# Patient Record
Sex: Male | Born: 1970 | Race: Black or African American | Hispanic: No | Marital: Married | Smoking: Former smoker
Health system: Southern US, Community
[De-identification: ages and names within clinical notes are randomized; demographics above are authoritative.]

## PROBLEM LIST (undated history)

## (undated) DIAGNOSIS — J343 Hypertrophy of nasal turbinates: Secondary | ICD-10-CM

## (undated) DIAGNOSIS — E785 Hyperlipidemia, unspecified: Secondary | ICD-10-CM

## (undated) DIAGNOSIS — R12 Heartburn: Secondary | ICD-10-CM

## (undated) DIAGNOSIS — H6990 Unspecified Eustachian tube disorder, unspecified ear: Secondary | ICD-10-CM

## (undated) DIAGNOSIS — M1990 Unspecified osteoarthritis, unspecified site: Secondary | ICD-10-CM

## (undated) DIAGNOSIS — M109 Gout, unspecified: Secondary | ICD-10-CM

## (undated) DIAGNOSIS — I10 Essential (primary) hypertension: Secondary | ICD-10-CM

## (undated) DIAGNOSIS — H6980 Other specified disorders of Eustachian tube, unspecified ear: Secondary | ICD-10-CM

## (undated) DIAGNOSIS — J342 Deviated nasal septum: Secondary | ICD-10-CM

## (undated) DIAGNOSIS — E669 Obesity, unspecified: Secondary | ICD-10-CM

## (undated) DIAGNOSIS — E119 Type 2 diabetes mellitus without complications: Secondary | ICD-10-CM

## (undated) DIAGNOSIS — J329 Chronic sinusitis, unspecified: Secondary | ICD-10-CM

## (undated) DIAGNOSIS — G4733 Obstructive sleep apnea (adult) (pediatric): Secondary | ICD-10-CM

## (undated) DIAGNOSIS — F329 Major depressive disorder, single episode, unspecified: Secondary | ICD-10-CM

## (undated) HISTORY — DX: Hyperlipidemia, unspecified: E78.5

## (undated) HISTORY — DX: Unspecified eustachian tube disorder, unspecified ear: H69.90

## (undated) HISTORY — DX: Hypertrophy of nasal turbinates: J34.3

## (undated) HISTORY — DX: Heartburn: R12

## (undated) HISTORY — DX: Major depressive disorder, single episode, unspecified: F32.9

## (undated) HISTORY — PX: TYMPANOSTOMY TUBE PLACEMENT: SHX32

## (undated) HISTORY — DX: Deviated nasal septum: J34.2

## (undated) HISTORY — DX: Other specified disorders of Eustachian tube, unspecified ear: H69.80

## (undated) HISTORY — PX: DENTAL SURGERY: SHX609

---

## 2011-12-31 ENCOUNTER — Encounter: Payer: BC Managed Care – PPO | Attending: Family Medicine | Admitting: *Deleted

## 2011-12-31 DIAGNOSIS — Z713 Dietary counseling and surveillance: Secondary | ICD-10-CM | POA: Insufficient documentation

## 2011-12-31 DIAGNOSIS — E119 Type 2 diabetes mellitus without complications: Secondary | ICD-10-CM | POA: Insufficient documentation

## 2012-01-01 ENCOUNTER — Encounter: Payer: Self-pay | Admitting: *Deleted

## 2012-01-01 NOTE — Patient Instructions (Signed)
Patient will attend Core Diabetes Courses II and III as scheduled or follow up prn.  

## 2012-01-01 NOTE — Progress Notes (Signed)
  Patient was seen on 12/31/11 for the first of a series of three diabetes self-management courses at the [facility]. The following learning objectives were met by the patient during this course:   Defines the role of glucose and insulin  Identifies type of diabetes and pathophysiology  Defines the diagnostic criteria for diabetes and prediabetes  States the risk factors for Type 2 Diabetes  States the symptoms of Type 2 Diabetes  Defines Type 2 Diabetes treatment goals  Defines Type 2 Diabetes treatment options  States the rationale for glucose monitoring  Identifies A1C, glucose targets, and testing times  Identifies proper sharps disposal  Defines the purpose of a diabetes food plan  Identifies carbohydrate food groups  Defines effects of carbohydrate foods on glucose levels  Identifies carbohydrate choices/grams/food labels  States benefits of physical activity and effect on glucose  Review of suggested activity guidelines  Handouts given during class include:  Type 2 Diabetes: Basics Book  My Food Plan Book  Food and Activity Log  Patient has established the following initial goals:  Increase exercise  Monitor glucose levels  Follow diabetes meal plan  Take medications appropriately  Lose weight  Keep doctors appointments  No results found for this basename: HGBA1C   Most recent A1C per referring provider = None provided  Follow-Up Plan: Pt to call PRN for additional education or follow-up

## 2013-02-11 ENCOUNTER — Other Ambulatory Visit (HOSPITAL_COMMUNITY): Payer: Self-pay | Admitting: Otolaryngology

## 2013-02-20 ENCOUNTER — Encounter (HOSPITAL_COMMUNITY): Payer: Self-pay

## 2013-02-26 ENCOUNTER — Encounter (HOSPITAL_COMMUNITY)
Admission: RE | Admit: 2013-02-26 | Discharge: 2013-02-26 | Disposition: A | Discharge status: Home / Self Care | Payer: BC Managed Care – PPO | Source: Ambulatory Visit | Attending: Otolaryngology | Admitting: Otolaryngology

## 2013-02-26 ENCOUNTER — Encounter (HOSPITAL_COMMUNITY): Payer: Self-pay

## 2013-02-26 HISTORY — DX: Unspecified osteoarthritis, unspecified site: M19.90

## 2013-02-26 HISTORY — DX: Essential (primary) hypertension: I10

## 2013-02-26 LAB — BASIC METABOLIC PANEL
CO2: 27 mEq/L (ref 19–32)
Chloride: 100 mEq/L (ref 96–112)
Sodium: 138 mEq/L (ref 135–145)

## 2013-02-26 LAB — SURGICAL PCR SCREEN: MRSA, PCR: NEGATIVE

## 2013-02-26 LAB — CBC
Platelets: 219 10*3/uL (ref 150–400)
RBC: 5.68 MIL/uL (ref 4.22–5.81)
WBC: 9.1 10*3/uL (ref 4.0–10.5)

## 2013-02-26 NOTE — Pre-Procedure Instructions (Signed)
Stephen Schmitt.  02/26/2013   Your procedure is scheduled on:  03-06-2013  Report to [facility] Stay [facility] at 6:45 AM.  Call this number if you have problems the morning of surgery: [phone]   Remember:   Do not eat food or drink liquids after midnight.   Take these medicines the morning of surgery with A SIP OF WATER: none   Do not wear jewelry, make-up or nail polish.  Do not wear lotions, powders, or perfumes. You may wear deodorant.  Do not shave 48 hours prior to surgery. Men may shave face and neck.  Do not bring valuables to the hospital.  Contacts, dentures or bridgework may not be worn into surgery.  Leave suitcase in the car. After surgery it may be brought to your room.   For patients admitted to the hospital, checkout time is 11:00 AM the day of  discharge.   Patients discharged the day of surgery will not be allowed to drive  home.    Special Instructions: Shower using CHG 2 nights before surgery and the night before surgery.  If you shower the day of surgery use CHG.  Use special wash - you have one bottle of CHG for all showers.  You should use approximately 1/3 of the bottle for each shower.   Please read over the following fact sheets that you were given: Pain Booklet and Surgical Site Infection Prevention

## 2013-02-26 NOTE — Progress Notes (Signed)
Ekg and CXR requested from [facility] practice [place].  Pt. States he had sleep study done approx. 3 weeks ago,he does not know where it was done,will attempt to locate.

## 2013-03-02 NOTE — Progress Notes (Signed)
Call to [facility] fm. GRP. at [facility], there is no mention in pt.'s chart about him having any sleep issues or concerns. PT. REMAINS UNSURE ABOUT WHERE , OR IF HE EVEN HAD A SLEEP STUDY.

## 2013-03-06 ENCOUNTER — Encounter (HOSPITAL_COMMUNITY): Payer: Self-pay | Admitting: *Deleted

## 2013-03-06 ENCOUNTER — Ambulatory Visit (HOSPITAL_COMMUNITY): Payer: BC Managed Care – PPO | Admitting: *Deleted

## 2013-03-06 ENCOUNTER — Observation Stay (HOSPITAL_COMMUNITY)
Admission: RE | Admit: 2013-03-06 | Discharge: 2013-03-07 | Disposition: A | Discharge status: Home / Self Care | Payer: BC Managed Care – PPO | Source: Ambulatory Visit | Attending: Otolaryngology | Admitting: Otolaryngology

## 2013-03-06 ENCOUNTER — Encounter (HOSPITAL_COMMUNITY)
Admission: RE | Disposition: A | Discharge status: Home / Self Care | Payer: Self-pay | Source: Ambulatory Visit | Attending: Otolaryngology

## 2013-03-06 DIAGNOSIS — I471 Supraventricular tachycardia: Secondary | ICD-10-CM

## 2013-03-06 DIAGNOSIS — M129 Arthropathy, unspecified: Secondary | ICD-10-CM | POA: Insufficient documentation

## 2013-03-06 DIAGNOSIS — H6980 Other specified disorders of Eustachian tube, unspecified ear: Secondary | ICD-10-CM | POA: Insufficient documentation

## 2013-03-06 DIAGNOSIS — J342 Deviated nasal septum: Principal | ICD-10-CM | POA: Insufficient documentation

## 2013-03-06 DIAGNOSIS — E119 Type 2 diabetes mellitus without complications: Secondary | ICD-10-CM | POA: Insufficient documentation

## 2013-03-06 DIAGNOSIS — E785 Hyperlipidemia, unspecified: Secondary | ICD-10-CM | POA: Insufficient documentation

## 2013-03-06 DIAGNOSIS — I498 Other specified cardiac arrhythmias: Secondary | ICD-10-CM

## 2013-03-06 DIAGNOSIS — H6990 Unspecified Eustachian tube disorder, unspecified ear: Secondary | ICD-10-CM | POA: Insufficient documentation

## 2013-03-06 DIAGNOSIS — I10 Essential (primary) hypertension: Secondary | ICD-10-CM | POA: Diagnosis present

## 2013-03-06 DIAGNOSIS — Z01812 Encounter for preprocedural laboratory examination: Secondary | ICD-10-CM | POA: Insufficient documentation

## 2013-03-06 DIAGNOSIS — G4733 Obstructive sleep apnea (adult) (pediatric): Secondary | ICD-10-CM | POA: Diagnosis present

## 2013-03-06 DIAGNOSIS — J353 Hypertrophy of tonsils with hypertrophy of adenoids: Secondary | ICD-10-CM | POA: Insufficient documentation

## 2013-03-06 DIAGNOSIS — J343 Hypertrophy of nasal turbinates: Secondary | ICD-10-CM | POA: Insufficient documentation

## 2013-03-06 HISTORY — PX: TYMPANOSTOMY TUBE PLACEMENT: SHX32

## 2013-03-06 HISTORY — PX: TONSILLECTOMY/ADENOIDECTOMY/TURBINATE REDUCTION: SHX6126

## 2013-03-06 HISTORY — DX: Type 2 diabetes mellitus without complications: E11.9

## 2013-03-06 HISTORY — DX: Obesity, unspecified: E66.9

## 2013-03-06 HISTORY — DX: Gout, unspecified: M10.9

## 2013-03-06 HISTORY — DX: Chronic sinusitis, unspecified: J32.9

## 2013-03-06 HISTORY — PX: NASAL SEPTOPLASTY W/ TURBINOPLASTY: SHX2070

## 2013-03-06 HISTORY — PX: TONSILLECTOMY: SHX5217

## 2013-03-06 HISTORY — PX: SEPTOPLASTY: SUR1290

## 2013-03-06 HISTORY — DX: Obstructive sleep apnea (adult) (pediatric): G47.33

## 2013-03-06 HISTORY — PX: MYRINGOTOMY WITH TUBE PLACEMENT: SHX5663

## 2013-03-06 LAB — GLUCOSE, CAPILLARY
Glucose-Capillary: 137 mg/dL — ABNORMAL HIGH (ref 70–99)
Glucose-Capillary: 153 mg/dL — ABNORMAL HIGH (ref 70–99)

## 2013-03-06 SURGERY — SEPTOPLASTY, NOSE, WITH NASAL TURBINATE REDUCTION
Anesthesia: General | Site: Nose | Wound class: Clean Contaminated

## 2013-03-06 MED ORDER — PROPOFOL 10 MG/ML IV BOLUS
INTRAVENOUS | Status: DC | PRN
  Administered 2013-03-06: 200 mg via INTRAVENOUS
  Administered 2013-03-06: 20 mg via INTRAVENOUS

## 2013-03-06 MED ORDER — CIPROFLOXACIN-DEXAMETHASONE 0.3-0.1 % OT SUSP
OTIC | Status: DC | PRN
  Administered 2013-03-06: 4 [drp] via OTIC

## 2013-03-06 MED ORDER — INSULIN ASPART 100 UNIT/ML ~~LOC~~ SOLN
0.0000 [IU] | Freq: Three times a day (TID) | SUBCUTANEOUS | Status: DC
  Administered 2013-03-06: 5 [IU] via SUBCUTANEOUS
  Administered 2013-03-07: 3 [IU] via SUBCUTANEOUS

## 2013-03-06 MED ORDER — METFORMIN HCL 500 MG PO TABS
500.0000 mg | ORAL_TABLET | Freq: Every day | ORAL | Status: DC
  Administered 2013-03-07: 500 mg via ORAL
  Filled 2013-03-06 (×2): qty 1

## 2013-03-06 MED ORDER — DEXAMETHASONE SODIUM PHOSPHATE 10 MG/ML IJ SOLN
10.0000 mg | Freq: Three times a day (TID) | INTRAMUSCULAR | Status: AC
  Administered 2013-03-06 (×3): 10 mg via INTRAVENOUS
  Filled 2013-03-06 (×5): qty 1

## 2013-03-06 MED ORDER — CIPROFLOXACIN-DEXAMETHASONE 0.3-0.1 % OT SUSP
OTIC | Status: AC
  Filled 2013-03-06: qty 7.5

## 2013-03-06 MED ORDER — PHENYLEPHRINE HCL 10 MG/ML IJ SOLN
INTRAMUSCULAR | Status: DC | PRN
  Administered 2013-03-06 (×3): 80 ug via INTRAVENOUS

## 2013-03-06 MED ORDER — SODIUM CHLORIDE 0.9 % IR SOLN
Status: DC | PRN
  Administered 2013-03-06: 1000 mL

## 2013-03-06 MED ORDER — ACETAMINOPHEN 160 MG/5ML PO SOLN: 325.0000 mg | ORAL | Status: DC | PRN

## 2013-03-06 MED ORDER — ROCURONIUM BROMIDE 100 MG/10ML IV SOLN
INTRAVENOUS | Status: DC | PRN
  Administered 2013-03-06: 40 mg via INTRAVENOUS

## 2013-03-06 MED ORDER — LIDOCAINE-EPINEPHRINE 1 %-1:100000 IJ SOLN
INTRAMUSCULAR | Status: AC
  Filled 2013-03-06: qty 1

## 2013-03-06 MED ORDER — NEOSTIGMINE METHYLSULFATE 1 MG/ML IJ SOLN
INTRAMUSCULAR | Status: DC | PRN
  Administered 2013-03-06: 4 mg via INTRAVENOUS

## 2013-03-06 MED ORDER — LISINOPRIL 2.5 MG PO TABS
2.5000 mg | ORAL_TABLET | Freq: Every day | ORAL | Status: DC
  Administered 2013-03-06: 2.5 mg via ORAL
  Filled 2013-03-06 (×2): qty 1

## 2013-03-06 MED ORDER — LACTATED RINGERS IV SOLN
INTRAVENOUS | Status: DC | PRN
  Administered 2013-03-06 (×2): via INTRAVENOUS

## 2013-03-06 MED ORDER — FENTANYL CITRATE 0.05 MG/ML IJ SOLN
INTRAMUSCULAR | Status: DC | PRN
  Administered 2013-03-06: 50 ug via INTRAVENOUS
  Administered 2013-03-06 (×3): 100 ug via INTRAVENOUS

## 2013-03-06 MED ORDER — FUROSEMIDE 10 MG/ML IJ SOLN
20.0000 mg | Freq: Once | INTRAMUSCULAR | Status: AC
  Administered 2013-03-06: 20 mg via INTRAVENOUS
  Filled 2013-03-06: qty 2

## 2013-03-06 MED ORDER — BACITRACIN ZINC 500 UNIT/GM EX OINT
TOPICAL_OINTMENT | CUTANEOUS | Status: DC | PRN
  Administered 2013-03-06: 1 via TOPICAL

## 2013-03-06 MED ORDER — OXYCODONE HCL 5 MG PO TABS: 5.0000 mg | ORAL_TABLET | Freq: Once | ORAL | Status: DC | PRN

## 2013-03-06 MED ORDER — ONDANSETRON HCL 4 MG PO TABS: 4.0000 mg | ORAL_TABLET | Freq: Four times a day (QID) | ORAL | Status: DC | PRN

## 2013-03-06 MED ORDER — INSULIN ASPART 100 UNIT/ML ~~LOC~~ SOLN: 0.0000 [IU] | Freq: Every day | SUBCUTANEOUS | Status: DC

## 2013-03-06 MED ORDER — GLYCOPYRROLATE 0.2 MG/ML IJ SOLN
INTRAMUSCULAR | Status: DC | PRN
  Administered 2013-03-06: 0.6 mg via INTRAVENOUS

## 2013-03-06 MED ORDER — OXYMETAZOLINE HCL 0.05 % NA SOLN
NASAL | Status: AC
  Filled 2013-03-06: qty 15

## 2013-03-06 MED ORDER — CEFAZOLIN SODIUM-DEXTROSE 2-3 GM-% IV SOLR
2.0000 g | Freq: Once | INTRAVENOUS | Status: AC
  Administered 2013-03-06: 2 g via INTRAVENOUS
  Filled 2013-03-06 (×2): qty 50

## 2013-03-06 MED ORDER — BACITRACIN ZINC 500 UNIT/GM EX OINT
TOPICAL_OINTMENT | CUTANEOUS | Status: AC
  Filled 2013-03-06: qty 15

## 2013-03-06 MED ORDER — ONDANSETRON HCL 4 MG/2ML IJ SOLN
4.0000 mg | Freq: Four times a day (QID) | INTRAMUSCULAR | Status: DC | PRN
Start: 2013-03-06 — End: 2013-03-06

## 2013-03-06 MED ORDER — ONDANSETRON HCL 4 MG/2ML IJ SOLN
INTRAMUSCULAR | Status: DC | PRN
  Administered 2013-03-06: 4 mg via INTRAVENOUS

## 2013-03-06 MED ORDER — LIDOCAINE HCL 4 % MT SOLN
OROMUCOSAL | Status: DC | PRN
  Administered 2013-03-06: 4 mL via TOPICAL

## 2013-03-06 MED ORDER — ONDANSETRON HCL 4 MG/2ML IJ SOLN: 4.0000 mg | Freq: Four times a day (QID) | INTRAMUSCULAR | Status: DC | PRN

## 2013-03-06 MED ORDER — METFORMIN HCL 500 MG PO TABS: 500.0000 mg | ORAL_TABLET | Freq: Every day | ORAL | Status: DC

## 2013-03-06 MED ORDER — HYDROMORPHONE HCL PF 1 MG/ML IJ SOLN
INTRAMUSCULAR | Status: AC
Start: 2013-03-06 — End: 2013-03-07
  Filled 2013-03-06: qty 1

## 2013-03-06 MED ORDER — LIDOCAINE-EPINEPHRINE 1 %-1:100000 IJ SOLN
INTRAMUSCULAR | Status: DC | PRN
  Administered 2013-03-06: 10 mL

## 2013-03-06 MED ORDER — HYDROMORPHONE HCL PF 1 MG/ML IJ SOLN
0.2500 mg | INTRAMUSCULAR | Status: DC | PRN
  Administered 2013-03-06: 0.5 mg via INTRAVENOUS

## 2013-03-06 MED ORDER — 0.9 % SODIUM CHLORIDE (POUR BTL) OPTIME
TOPICAL | Status: DC | PRN
  Administered 2013-03-06: 1000 mL

## 2013-03-06 MED ORDER — LIDOCAINE HCL (CARDIAC) 20 MG/ML IV SOLN
INTRAVENOUS | Status: DC | PRN
  Administered 2013-03-06: 80 mg via INTRAVENOUS

## 2013-03-06 MED ORDER — HYDROCODONE-ACETAMINOPHEN 7.5-325 MG/15ML PO SOLN
15.0000 mL | ORAL | Status: DC | PRN
  Administered 2013-03-06 – 2013-03-07 (×2): 15 mL via ORAL
  Filled 2013-03-06 (×2): qty 15

## 2013-03-06 MED ORDER — ATORVASTATIN CALCIUM 20 MG PO TABS
20.0000 mg | ORAL_TABLET | Freq: Every day | ORAL | Status: DC
  Filled 2013-03-06 (×2): qty 1

## 2013-03-06 MED ORDER — MORPHINE SULFATE 2 MG/ML IJ SOLN
2.0000 mg | INTRAMUSCULAR | Status: DC | PRN
  Administered 2013-03-06: 2 mg via INTRAVENOUS
  Filled 2013-03-06: qty 1

## 2013-03-06 MED ORDER — SUCCINYLCHOLINE CHLORIDE 20 MG/ML IJ SOLN
INTRAMUSCULAR | Status: DC | PRN
  Administered 2013-03-06: 100 mg via INTRAVENOUS

## 2013-03-06 MED ORDER — MIDAZOLAM HCL 5 MG/5ML IJ SOLN
INTRAMUSCULAR | Status: DC | PRN
  Administered 2013-03-06: 2 mg via INTRAVENOUS

## 2013-03-06 MED ORDER — DEXTROSE-NACL 5-0.9 % IV SOLN
INTRAVENOUS | Status: DC
  Administered 2013-03-06: 14:00:00 via INTRAVENOUS

## 2013-03-06 MED ORDER — OXYMETAZOLINE HCL 0.05 % NA SOLN
NASAL | Status: DC | PRN
  Administered 2013-03-06: 1 via NASAL

## 2013-03-06 MED ORDER — CEFAZOLIN SODIUM 1-5 GM-% IV SOLN
1.0000 g | Freq: Two times a day (BID) | INTRAVENOUS | Status: DC
  Administered 2013-03-06 (×2): 1 g via INTRAVENOUS
  Filled 2013-03-06 (×4): qty 50

## 2013-03-06 MED ORDER — LACTATED RINGERS IV SOLN
INTRAVENOUS | Status: DC
  Administered 2013-03-06: 50 mL/h via INTRAVENOUS

## 2013-03-06 MED ORDER — OXYCODONE HCL 5 MG/5ML PO SOLN: 5.0000 mg | Freq: Once | ORAL | Status: DC | PRN

## 2013-03-06 MED ORDER — CIPROFLOXACIN-DEXAMETHASONE 0.3-0.1 % OT SUSP
4.0000 [drp] | Freq: Two times a day (BID) | OTIC | Status: DC
  Administered 2013-03-06: 4 [drp] via OTIC
  Filled 2013-03-06: qty 7.5

## 2013-03-06 SURGICAL SUPPLY — 56 items
ASPIRATOR COLLECTOR MID EAR (MISCELLANEOUS) IMPLANT
BENZOIN TINCTURE PRP APPL 2/3 (GAUZE/BANDAGES/DRESSINGS) ×4 IMPLANT
BLADE INF TURB ROT M4 2 5PK (BLADE) ×4 IMPLANT
BLADE MYRINGOTOMY 6 SPEAR HDL (BLADE) ×4 IMPLANT
CANISTER SUCTION 2500CC (MISCELLANEOUS) ×4 IMPLANT
CATH ROBINSON RED A/P 10FR (CATHETERS) ×4 IMPLANT
CLEANER TIP ELECTROSURG 2X2 (MISCELLANEOUS) ×4 IMPLANT
CLOTH BEACON ORANGE TIMEOUT ST (SAFETY) ×4 IMPLANT
COAGULATOR SUCT SWTCH 10FR 6 (ELECTROSURGICAL) ×4 IMPLANT
COTTONBALL LRG STERILE PKG (GAUZE/BANDAGES/DRESSINGS) ×4 IMPLANT
COVER MAYO STAND STRL (DRAPES) ×8 IMPLANT
CRADLE DONUT ADULT HEAD (MISCELLANEOUS) IMPLANT
DECANTER SPIKE VIAL GLASS SM (MISCELLANEOUS) ×4 IMPLANT
DRAPE PROXIMA HALF (DRAPES) ×4 IMPLANT
DRESSING TELFA 8X3 (GAUZE/BANDAGES/DRESSINGS) IMPLANT
DRSG NASOPORE 8CM (GAUZE/BANDAGES/DRESSINGS) IMPLANT
ELECT COATED BLADE 2.86 ST (ELECTRODE) ×4 IMPLANT
ELECT REM PT RETURN 9FT ADLT (ELECTROSURGICAL) ×4
ELECT REM PT RETURN 9FT PED (ELECTROSURGICAL)
ELECTRODE REM PT RETRN 9FT PED (ELECTROSURGICAL) IMPLANT
ELECTRODE REM PT RTRN 9FT ADLT (ELECTROSURGICAL) ×3 IMPLANT
FILTER ARTHROSCOPY CONVERTOR (FILTER) IMPLANT
GAUZE SPONGE 4X4 16PLY XRAY LF (GAUZE/BANDAGES/DRESSINGS) ×4 IMPLANT
GLOVE BIOGEL PI IND STRL 6 (GLOVE) ×3 IMPLANT
GLOVE BIOGEL PI INDICATOR 6 (GLOVE) ×1
GLOVE SURG SS PI 6.5 STRL IVOR (GLOVE) ×4 IMPLANT
GLOVE SURG SS PI 7.5 STRL IVOR (GLOVE) ×8 IMPLANT
GOWN STRL NON-REIN LRG LVL3 (GOWN DISPOSABLE) ×12 IMPLANT
KIT BASIN OR (CUSTOM PROCEDURE TRAY) ×4 IMPLANT
KIT ROOM TURNOVER OR (KITS) ×4 IMPLANT
NEEDLE HYPO 25X1 1.5 SAFETY (NEEDLE) ×4 IMPLANT
NS IRRIG 1000ML POUR BTL (IV SOLUTION) ×4 IMPLANT
PACK EENT II TURBAN DRAPE (CUSTOM PROCEDURE TRAY) ×4 IMPLANT
PACK SURGICAL SETUP 50X90 (CUSTOM PROCEDURE TRAY) IMPLANT
PAD ARMBOARD 7.5X6 YLW CONV (MISCELLANEOUS) ×8 IMPLANT
PATTIES SURGICAL .5 X3 (DISPOSABLE) ×4 IMPLANT
PENCIL BUTTON HOLSTER BLD 10FT (ELECTRODE) ×4 IMPLANT
SOLUTION ANTI FOG 6CC (MISCELLANEOUS) ×4 IMPLANT
SPECIMEN JAR SMALL (MISCELLANEOUS) ×8 IMPLANT
SPLINT NASAL DOYLE BI-VL (GAUZE/BANDAGES/DRESSINGS) ×4 IMPLANT
SPONGE TONSIL 1 RF SGL (DISPOSABLE) IMPLANT
STRIP CLOSURE SKIN 1/2X4 (GAUZE/BANDAGES/DRESSINGS) ×4 IMPLANT
SUT CHROMIC GUT 2 0 PS 2 27 (SUTURE) IMPLANT
SUT ETHILON 2 0 FS 18 (SUTURE) ×4 IMPLANT
SUT ETHILON 3 0 PS 1 (SUTURE) IMPLANT
SUT PLAIN 4 0 ~~LOC~~ 1 (SUTURE) ×4 IMPLANT
SYR BULB 3OZ (MISCELLANEOUS) ×4 IMPLANT
SYRINGE 10CC LL (SYRINGE) ×4 IMPLANT
TOWEL OR 17X24 6PK STRL BLUE (TOWEL DISPOSABLE) ×8 IMPLANT
TOWEL OR 17X26 10 PK STRL BLUE (TOWEL DISPOSABLE) ×4 IMPLANT
TRAY ENT MC OR (CUSTOM PROCEDURE TRAY) ×4 IMPLANT
TUBE CONNECTING 12X1/4 (SUCTIONS) ×4 IMPLANT
TUBE EAR ARMSTRONG FL 1.14X3.5 (OTOLOGIC RELATED) IMPLANT
TUBE SALEM SUMP 12R W/ARV (TUBING) ×4 IMPLANT
WATER STERILE IRR 1000ML POUR (IV SOLUTION) IMPLANT
YANKAUER SUCT BULB TIP NO VENT (SUCTIONS) ×4 IMPLANT

## 2013-03-06 NOTE — Anesthesia Preprocedure Evaluation (Signed)
Anesthesia Evaluation  Patient identified by MRN, date of birth, ID band Patient awake    Reviewed: Allergy & Precautions, H&P , NPO status , Patient's Chart, lab work & pertinent test results  Airway Mallampati: II  Neck ROM: full    Dental   Pulmonary former smoker,          Cardiovascular hypertension,     Neuro/Psych    GI/Hepatic   Endo/Other  diabetes, Type obesity  Renal/GU      Musculoskeletal  (+) Arthritis -,   Abdominal   Peds  Hematology   Anesthesia Other Findings   Reproductive/Obstetrics                           Anesthesia Physical Anesthesia Plan  ASA: II  Anesthesia Plan: General   Post-op Pain Management:    Induction: Intravenous  Airway Management Planned: Oral ETT  Additional Equipment:   Intra-op Plan:   Post-operative Plan: Extubation in OR  Informed Consent: I have reviewed the patients History and Physical, chart, labs and discussed the procedure including the risks, benefits and alternatives for the proposed anesthesia with the patient or authorized representative who has indicated his/her understanding and acceptance.     Plan Discussed with: CRNA and Surgeon  Anesthesia Plan Comments:         Anesthesia Quick Evaluation

## 2013-03-06 NOTE — Consult Note (Signed)
Referring Physician: Dr Melvenia Beam Primary Physician: Dr. Catha Gosselin at [facility] at [facility] Primary Cardiologist: none Reason for Consultation: PAF/RVR  HPI: Stephen Schmitt is a 42 year old male with no previous cardiac issues. He has a history of sleep apnea and was admitted today for endoscopic septoplasty, inferior turbinate reduction, Right tympanostomy tube, and Adenotonsillectomy >69 years old. In the PACU, he was in an out of atrial fibrillation. No strips or ECG are available for review. He is not currently on telemetry.  Currently, the patient is resting comfortably. He has never had palpitations. He has never had chest pain except for one episode that he states was chest wall pain. He denies any change in his dyspnea on exertion. He will occasionally feel short of breath at the end of a flight of stairs or other exertion but generally feels that he does well and his ability to exert himself has not changed recently. He works as a Production designer, theatre/television/film man and is active at work but does not exercise. He has no recent difficulties or problems. He remembers them talking about the abnormal heart rhythm but had no awareness of it. His throat is very sore and he is a little sleepy but he can talk without difficulty and answers questions well. Mild dyspnea.   Review of Systems:     Cardiac Review of Systems: {Y] = yes [ ]  = no  Chest Pain [    ]  Resting SOB [   ] Exertional SOB  [ y ] - chronic, no change  Orthopnea [  ]   Pedal Edema [   ]    Palpitations [  ] Syncope  [  ]   Presyncope [   ]  General Review of Systems: [Y] = yes [  ]=no Constitional: recent weight change[  ]; anorexia[ ] ; fatigue[  ]; nausea [ ] ; night sweats [  ]; fever [  ]; or chills [  ];                                                           Dental: poor dentition[  ]; he has bleeding from his gums frequently when he brushes his teeth  Eye :blurred vision [  ]; diplopia[   ]; vision changes [  ];   Amaurosis fugax[ ] ; Resp: cough [  ];  wheezing[  ];  hemoptysis[  ]; shortness of breath[  ]; paroxysmal nocturnal dyspnea[  ]; dyspnea on exertion[  ]; or orthopnea[  ];  GI:  gallstones[  ], vomiting[  ];  dysphagia[  ]; melena[  ]; hematochezia[  ]; heartburn[  ];   Hx of  Colonoscopy[  ]; GU: kidney stones [  ]; hematuria[  ];   dysuria [  ];  nocturia[  ];  history of     obstruction [  ];              Skin: rash, swelling[  ];, hair loss[  ];  peripheral edema[  ];  or itching[  ]; Musculosketetal: myalgias[  ];  joint swelling[  ];  joint erythema[  ];  joint pain[  ];  back pain[  ];  Heme/Lymph: bruising[  ];  bleeding[  ];  anemia[  ];  Neuro: TIA[  ];  headaches[  ];  stroke[  ];  vertigo[  ];  seizures[  ];   paresthesias[  ];  difficulty walking[  ];  Psych:depression[  ]; anxiety[  ];  Endocrine: diabetes[  ];  thyroid dysfunction[  ];  Immunizations: Flu [  ]; Pneumococcal[  ];  Other: Full 14-point review of systems is otherwise negative unless stated in the history of present illness.  Past Medical History  Diagnosis Date  . DM (diabetes mellitus)   . Hyperlipidemia   . Hypertension   . Arthritis   . OSA (obstructive sleep apnea)     With: tonsillar hypertrophy, snoring, septal deviation, inferior turbinate hypertrophy, eustachian tube dysfunction  . Obesity    Past Surgical History  Procedure Laterality Date  . Tympanostomy tube placement    . Dental surgery    . Septoplasty  03/06/2013    endoscopic  . Tympanostomy tube placement Right 03/06/2013  . Tonsillectomy/adenoidectomy/turbinate reduction  03/06/2013    inferior    Medications Prior to Admission  Medication Sig Dispense Refill  . atorvastatin (LIPITOR) 20 MG tablet Take 20 mg by mouth daily.      Marland Kitchen ibuprofen (ADVIL,MOTRIN) 800 MG tablet Take 800 mg by mouth every 8 (eight) hours as needed for pain.      Marland Kitchen lisinopril (PRINIVIL,ZESTRIL) 2.5 MG tablet Take 2.5 mg by mouth daily.      . metFORMIN  (GLUCOPHAGE) 500 MG tablet Take 500 mg by mouth daily.       Current medications: . atorvastatin  20 mg Oral Daily  .  ceFAZolin (ANCEF) IV  1 g Intravenous Q12H  . ciprofloxacin-dexamethasone  4 drop Both Ears BID  . dexamethasone  10 mg Intravenous Q8H  . HYDROmorphone      . insulin aspart  0-15 Units Subcutaneous TID WC  . insulin aspart  0-5 Units Subcutaneous QHS  . lisinopril  2.5 mg Oral Daily  . [START ON 03/07/2013] metFORMIN  500 mg Oral Q breakfast   Infusions: . dextrose 5 % and 0.9% NaCl 100 mL/hr at 03/06/13 1354  . lactated ringers 50 mL/hr (03/06/13 0809)   Allergies  Allergen Reactions  . Iodine     Shrimp allergy  . Papaya Derivatives    History   Social History  . Marital Status: Married    Spouse Name: N/A    Number of Children: N/A  . 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 Smoking status: Former Smoker -- 0.50 packs/day for 15 years    Types: Cigarettes    Quit date: 12/24/2002  . Smokeless tobacco: Not on file  . Alcohol Use: 7.2 oz/week    12 Cans of beer per week  . Drug Use: Yes    Special: Marijuana     Comment: when younger  . Sexually Active: Not on file   Other Topics Concern  . Not on file   Social History Narrative   Lives with wife, activity at work but does not exercise. None of his siblings have any cardiac issues.   Family Status  Relation Status Death Age  . Mother Deceased 70s    No CAD  . Father Deceased 82s    No CAD   PHYSICAL EXAM: Filed Vitals:   03/06/13 1438  BP: 135/61  Pulse: 69  Temp: 98 F (36.7 C)  Resp: 16    Intake/Output Summary (Last 24 hours) at 03/06/13 1507 Last  data filed at 03/06/13 1109  Gross per 24 hour  Intake   1200 ml  Output     50 ml  Net   1150 ml    General:  Well appearing male. No respiratory difficulty HEENT:  Normal except some diffuse facial swelling. Neck: supple. JVP hard to see. Appears mildly elevated.  Carotids 2+ bilat; no bruits. No lymphadenopathy or thryomegaly appreciated. Cor: PMI nondisplaced. Regular rate & rhythm. No rubs, gallops or murmurs. Lungs: clear to auscultation bilaterally Abdomen: soft, nontender, nondistended. No hepatosplenomegaly. No bruits or masses. Decreased bowel sounds but present. Extremities: no cyanosis, clubbing, rash. tr edema Neuro: Alert sleepy from medications but oriented x 3, cranial nerves grossly intact. moves all 4 extremities w/o difficulty. Affect pleasant.  ECG: performed 02/10/2013, sinus rhythm, rate 64, normal intervals and axis, early R wave transition in precordial leads, no ischemic changes  Results for orders placed during the hospital encounter of 03/06/13 (from the past 24 hour(s))  GLUCOSE, CAPILLARY     Status: Abnormal   Collection Time    03/06/13  7:01 AM      Result Value Range   Glucose-Capillary 137 (*) 70 - 99 mg/dL  GLUCOSE, CAPILLARY     Status: Abnormal   Collection Time    03/06/13  9:04 AM      Result Value Range   Glucose-Capillary 125 (*) 70 - 99 mg/dL  GLUCOSE, CAPILLARY     Status: Abnormal   Collection Time    03/06/13 11:25 AM      Result Value Range   Glucose-Capillary 153 (*) 70 - 99 mg/dL   Comment 1 Documented in Chart      Lab Results  Component Value Date   WBC 9.1 02/26/2013   HGB 13.4 02/26/2013   HCT 41.1 02/26/2013   MCV 72.4* 02/26/2013   PLT 219 02/26/2013   Component Date Value Range Status  . MRSA, PCR 02/26/2013 NEGATIVE  NEGATIVE Final  . Staphylococcus aureus 02/26/2013 NEGATIVE  NEGATIVE Final         . Sodium 02/26/2013 138  135 - 145 mEq/L Final  . Potassium 02/26/2013 4.1  3.5 - 5.1 mEq/L Final  . Chloride 02/26/2013 100  96 - 112 mEq/L Final  . CO2 02/26/2013 27  19 - 32 mEq/L Final  . Glucose, Bld 02/26/2013 138* 70 - 99 mg/dL Final  . BUN 16/09/9603 15  6 - 23 mg/dL Final  . Creatinine, Ser 02/26/2013 0.80  0.50 - 1.35 mg/dL Final  . Calcium 54/08/8118 9.8  8.4 - 10.5 mg/dL Final  .  GFR calc non Af Amer 02/26/2013 >90  >90 mL/min Final  . GFR calc Af Amer 02/26/2013 >90  >90 mL/min Final    ASSESSMENT/PLAN: 1.  OSA (obstructive sleep apnea) 2.  DM (diabetes mellitus) 3.  Hyperlipidemia 4.  Hypertension 5. PAF  - no telemetry is available for review. His heart rate is currently regular and within normal limits. We'll check an ECG, a TSH and an echocardiogram. Would place on telemetry overnight. He was bradycardic at times earlier today with heart rates documented in the 50s. M.D. advise on further testing/evaluation. Given post-operative risk of bleeding, will not anticoagulate at this time. Theodore Demark, PA-C 03/06/2013 4:47 PM  ATTENDING:  Patient seen and examined with Theodore Demark, PA-C. We discussed all aspects of the encounter. I agree with the assessment and plan as stated above.   Stephen Schmitt was reported to have some post-op atrial arrhythmias but unfortunately  we do not have strips to document. Given OSA though, he would be at high risk for these. He is now in NSR and ECG is normal. Will place on tele and get echo for completeness sake.  He does appear to have some mild overload sow ill stop IVF and give one dose of IV lasix. Otherwise not much else to do as inpatient.   With recent very positive sleep study he will need initiation of CPAP as outpatient as uncontrolled OSA will be high risk for recurrent arrhythmias and other heart problems down the road.   D/w Dr. Emeline Darling by telephone.   Daniel Bensimhon,MD 5:04 PM

## 2013-03-06 NOTE — Progress Notes (Signed)
Awake and alert. Cardiologist was just here to evaluate. Appreciate the input. No bleeding, breathing fine. Continue to observe overnight. Anticipate discharge in morning.

## 2013-03-06 NOTE — Progress Notes (Signed)
PACU nurse called and stated patient is in and out of Afib, which he does not have a history of. I consulted [facility], would like to avoid anticoagulation given fresh nasal and tonsil surgery and high risk of bleeding.

## 2013-03-06 NOTE — Anesthesia Procedure Notes (Signed)
Procedure Name: Intubation Date/Time: 03/06/2013 9:20 AM Performed by: Ellin Goodie Pre-anesthesia Checklist: Patient identified, Emergency Drugs available, Suction available, Patient being monitored and Timeout performed Patient Re-evaluated:Patient Re-evaluated prior to inductionOxygen Delivery Method: Circle system utilized Preoxygenation: Pre-oxygenation with 100% oxygen Intubation Type: IV induction Ventilation: Mask ventilation without difficulty Laryngoscope Size: Mac and 3 Grade View: Grade II Tube type: Oral Tube size: 7.5 mm Number of attempts: 1 Airway Equipment and Method: Stylet Placement Confirmation: ETT inserted through vocal cords under direct vision,  positive ETCO2 and breath sounds checked- equal and bilateral Secured at: 23 cm Tube secured with: Tape Dental Injury: Teeth and Oropharynx as per pre-operative assessment  Comments: Intubation by [employer].

## 2013-03-06 NOTE — Op Note (Addendum)
DATE OF OPERATION: 03/06/2013 Surgeon: Melvenia Beam Procedure Performed: endoscopic septoplasty [phone]  inferior turbinate reduction 30140-50 Right tympanostomy tube-69436-[id number] Adenotonsillectomy >44 years old 42  PREOPERATIVE DIAGNOSIS: septal deviation, inferior turbinate hypertrophy refractory to maximal medical therapy, adenotonsillar hypertrophy, severe obstructive sleep apnea, eustachian tube dysfunction POSTOPERATIVE DIAGNOSIS: septal deviation, inferior turbinate hypertrophy refractory to maximal medical therapy, adenotonsillar hypertrophy, severe obstructive sleep apnea, eustachian tube dysfunction  SURGEON: Melvenia Beam ANESTHESIA: General endotracheal.  ESTIMATED BLOOD LOSS: less than 25 mL.  DRAINS: Doyle splints SPECIMENS: nasal contents not sent, adenoids not sent, tonsils sent for pathology INDICATIONS: The patient is a 42yo with a history of septal deviation, inferior turbinate hypertrophy refractory to maximal medical therapy, adenotonsillar hypertrophy, severe obstructive sleep apnea, eustachian tube dysfunction DESCRIPTION OF OPERATION: The patient was brought to the operating room and was placed in the supine position and was placed under general endotracheal anesthesia by anesthesiology.   The microscope was used to examine the right TM. Cerumen was removed using the suction and currette. An anterior-inferior myringotomy was made using the myringotomy knife. No Effusion was seen but the right TM was severely retracted. A Sheehy collar button tube was placed in the myringtomy, irrigated with floxin drops, and the EAC was dressed with a cotton ball. The microscope was then used to examine the left TM. Cerumen was removed using the suction and currette. A patent T-tube was seen and this was left in place and irrigated with floxin drops, and the EAC was dressed with a cotton ball.   The patient's nose was inspected and submucosal injections of lidocaine 1% with epinephrine  1:100,000 were placed in the septum and inferior turbinates in the standard fashion. The nose was decongested with Afrin-coated pledgets which were then removed. The patient was prepped and draped in the usual sterile fashion. After the Afrin pledgets were removed, the inferior turbinates were outfractured.  I first began with the septoplasty. Using the zero degree endoscope a Killian incision was made on the right side of the septum using a #15 blade. The Cottle elevator was then used to elevate a submucoperichondrial and submucoperiostial  flap. The cartilaginous septum was incised anteriorly using the cartilage knife leaving a generous 1cm anterior and superior strut. A submucoperichondrial and submucopeiosteal flap was elevated on the opposite/left side as well. The deviated portions of the cartilage and bone including 2 large obstructive left sided septal spurs were resected as needed using the Jansen-Middleton, Thru-Cuts, and Blakesley forceps. The mucosal flaps were then reapproximated and sutured using a through and through 4-0 plain gut whip stitch. The patient's nasal airway was inspected and was found to be widely patent. The incision site was then closed with the 4-0 plain gut suture.  Inferior turbinate reduction was performed by elevating bilateral submucoperiosteal flaps using the Cottle elevator. The microdebrider was then used to debride the submucous erectile turbinate tissue bilaterally, with excellent reduction of the turbinates in a submucous fashion. The mucosal flaps were then reduced back down to the turbinates and the incisions and submucous tissue cauterized as needed using the suction Bovie.  Once hemostasis was noted the septum was bolstered using Doyle splints which were secured using a transcollumellar 3-0 Nylon suture with the knot in the left nostril.   Next the palate was inspected and palpated and noted to be intact with no submucous cleft. The uvula was elongated so the  inferior 20% was trimmed with the Bovie. The adenoids were inspected with a dental mirror and noted to be hypertrophic.  The adenoids were removed with the suction Bovie. The adenoid pad was packed for 5 minutes after which the packs were removed and meticulous hemostasis was obtained on the adenoid pad using the suction Bovie.  Next the right tonsil was grasped with a curved Allis clamp and dissected from the right tonsillar fossa using the Bovie. Multiple tonsilliths/chronic tonsillitis was noted bilaterally. Meticulous hemostasis was then achieved. The left tonsil was then grasped with the curved Allis and dissected from the left tonsillar fossa using the Bovie. Meticulous hemostasis was achieved. The nasal cavity and oropharynx were irrigated out and then the the nose, oral cavity,  and stomach were suctioned out. The patient was turned back to anesthesia and awakened from anesthesia and extubated without difficulty. The patient tolerated the procedure well with no immediate complications and was taken to the postoperative recovery area in good condition.   Dr. Melvenia Beam was present and performed the entire procedure. 03/06/2013  11:19 AM Melvenia Beam

## 2013-03-06 NOTE — Preoperative (Signed)
Beta Blockers   Reason not to administer Beta Blockers:Not Applicable 

## 2013-03-06 NOTE — H&P (Signed)
03/06/2013  Melvenia Beam  PREOPERATIVE HISTORY AND PHYSICAL  CHIEF COMPLAINT: obstructive sleep apnea, tonsillar hypertrophy, snoring, septal deviation, inferior turbinate hypertrophy, eustachian tube dysfunction  HISTORY: This is a 42 year old who presents with severe obstructive sleep apnea (AHI ~35), tonsillar hypertrophy, snoring, septal deviation, inferior turbinate hypertrophy & eustachian tube dysfunction.  He now presents for tonsillectomy, inferior turbinate reduction, tympanostomy tube placement, and septoplasty.  Dr. Emeline Darling, [place] has discussed the risks (bleeding, risks of general anesthesia, death, septal perforation, scarring), benefits, and alternatives of this procedure. The patient understands the risks and would like to proceed with the procedure. The chances of success of the procedure are >50% and the patient understands this. I personally performed an examination of the patient within 24 hours of the procedure.  PAST MEDICAL HISTORY: Past Medical History  Diagnosis Date  . Diabetes mellitus   . Hyperlipidemia   . Hypertension   . Arthritis      PAST SURGICAL HISTORY: Past Surgical History  Procedure Laterality Date  . Tympanostomy tube placement    . Dental surgery      MEDICATIONS: No current facility-administered medications on file prior to encounter.   No current outpatient prescriptions on file prior to encounter.    ALLERGIES: Allergies  Allergen Reactions  . Food     Pt allergic to shrimp and papaya    SOCIAL HISTORY: History   Social History  . Marital Status: Married    Spouse Name: N/A    Number of Children: N/A  . Years of Education: N/A   Occupational History  . Not on file.   Social History Main Topics  . Smoking status: Former Smoker -- 0.50 packs/day for 15 years    Types: Cigarettes    Quit date: 12/24/2002  . Smokeless tobacco: Not on file  . Alcohol Use: 7.2 oz/week    12 Cans of beer per week  . Drug Use: Yes   Special: Marijuana     Comment: when younger  . Sexually Active: Not on file   Other Topics Concern  . Not on file   Social History Narrative  . No narrative on file    FAMILY HISTORY: No family history on file.  REVIEW OF SYSTEMS:  HEENT:snoring, eustachian tube dysfunction, sleep apnea, nasal obstruction, otherwise negative x 10 systems except per HPI   PHYSICAL EXAM:  GENERAL:  NAD VITAL SIGNS:  There were no vitals filed for this visit.  SKIN:  Warm, dry HEENT:  3+ tonsils NECK:  supple LYMPH:  No LAD LUNGS:  Grossly clear CARDIOVASCULAR:  RRR ABDOMEN:  Soft, NT MUSCULOSKELETAL: normal strength PSYCH: normal affect  NEUROLOGIC:  CN 2-12 intact and symmetric  DIAGNOSTIC STUDIES: sleep study with AHI ~35, REM AHI ~70  ASSESSMENT AND PLAN: Plan to proceed with tymapnostomy tubes, septoplasty, inferior turbinate reduction, tonsillectomy. Patient understands the risks, benefits, and alternatives.  03/06/2013  7:03 AM Melvenia Beam

## 2013-03-06 NOTE — Transfer of Care (Signed)
Immediate Anesthesia Transfer of Care Note  Patient: Stephen Schmitt.  Procedure(s) Performed: Procedure(s) with comments: NASAL SEPTOPLASTY WITH TURBINATE REDUCTION (N/A) TONSILLECTOMY (Bilateral) MYRINGOTOMY WITH TUBE PLACEMENT (Bilateral) -  Replacement of left tube and new tube in right ear.  Patient Location: PACU  Anesthesia Type:General  Level of Consciousness: awake, patient cooperative and responds to stimulation  Airway & Oxygen Therapy: Patient Spontanous Breathing and Patient connected to face mask oxygen  Post-op Assessment: Report given to PACU RN, Post -op Vital signs reviewed and stable and Patient moving all extremities X 4  Post vital signs: Reviewed and stable  Complications: No apparent anesthesia complications

## 2013-03-07 ENCOUNTER — Encounter (HOSPITAL_COMMUNITY): Payer: Self-pay | Admitting: Otolaryngology

## 2013-03-07 LAB — BASIC METABOLIC PANEL
BUN: 11 mg/dL (ref 6–23)
CO2: 25 mEq/L (ref 19–32)
Calcium: 9.5 mg/dL (ref 8.4–10.5)
Creatinine, Ser: 0.58 mg/dL (ref 0.50–1.35)
Glucose, Bld: 187 mg/dL — ABNORMAL HIGH (ref 70–99)
Sodium: 137 mEq/L (ref 135–145)

## 2013-03-07 NOTE — Progress Notes (Signed)
UR completed 

## 2013-03-07 NOTE — Discharge Summary (Signed)
Physician Discharge Summary  Patient ID: Stephen Schmitt. MRN: [id number] DOB/AGE: [birth year] 42 y.o.  Admit date: 03/06/2013 Discharge date: 03/07/2013  Admission Diagnoses: OSA  Discharge Diagnoses:  Principal Problem:   OSA (obstructive sleep apnea) Active Problems:   DM (diabetes mellitus)   Hyperlipidemia   Hypertension   SVT (supraventricular tachycardia)   Discharged Condition: good  Hospital Course: brief A-fib but no further arhythmia documented on overnight telemetry. Seen by Cardiology - they will follow up as out patient.  Consults: none  Significant Diagnostic Studies: none  Treatments: surgery: T&A/Septoplasty  Discharge Exam: Blood pressure 136/62, pulse 87, temperature 97.9 F (36.6 C), temperature source Oral, resp. rate 16, height 6' (1.829 m), weight 298 lb (135.172 kg), SpO2 98.00%. PHYSICAL EXAM: Awake and alert, nasal packing in place. Pharynx with no bleeding - normal early eschar. Breathing without difficulty.  Disposition: Final discharge disposition not confirmed     Medication List    ASK your doctor about these medications       atorvastatin 20 MG tablet  Commonly known as:  LIPITOR  Take 20 mg by mouth daily.     ibuprofen 800 MG tablet  Commonly known as:  ADVIL,MOTRIN  Take 800 mg by mouth every 8 (eight) hours as needed for pain.     lisinopril 2.5 MG tablet  Commonly known as:  PRINIVIL,ZESTRIL  Take 2.5 mg by mouth daily.     metFORMIN 500 MG tablet  Commonly known as:  GLUCOPHAGE  Take 500 mg by mouth daily.           Follow-up Information   Follow up with Melvenia Beam, MD. Schedule an appointment as soon as possible for a visit in 1 week.   Contact information:   [address] [place] [address] [phone]       Signed: Serena Colonel 03/07/2013, 7:32 AM

## 2013-03-11 NOTE — Anesthesia Postprocedure Evaluation (Signed)
Anesthesia Post Note  Patient: Stephen Schmitt.  Procedure(s) Performed: Procedure(s) (LRB): NASAL SEPTOPLASTY WITH TURBINATE REDUCTION (N/A) TONSILLECTOMY (Bilateral) MYRINGOTOMY WITH TUBE PLACEMENT (Bilateral)  Anesthesia type: General  Patient location: PACU  Post pain: Pain level controlled and Adequate analgesia  Post assessment: Post-op Vital signs reviewed, Patient's Cardiovascular Status Stable, Respiratory Function Stable, Patent Airway and Pain level controlled  Last Vitals:  Filed Vitals:   03/07/13 0622  BP: 136/62  Pulse:   Temp:   Resp:     Post vital signs: Reviewed and stable  Level of consciousness: awake, alert  and oriented  Complications: No apparent anesthesia complications

## 2013-03-16 ENCOUNTER — Encounter (HOSPITAL_COMMUNITY): Payer: Self-pay | Admitting: Emergency Medicine

## 2013-03-16 ENCOUNTER — Emergency Department (HOSPITAL_COMMUNITY)
Admission: EM | Admit: 2013-03-16 | Discharge: 2013-03-17 | Disposition: A | Discharge status: Home / Self Care | Payer: BC Managed Care – PPO | Attending: Emergency Medicine | Admitting: Emergency Medicine

## 2013-03-16 DIAGNOSIS — J029 Acute pharyngitis, unspecified: Secondary | ICD-10-CM | POA: Insufficient documentation

## 2013-03-16 DIAGNOSIS — I10 Essential (primary) hypertension: Secondary | ICD-10-CM | POA: Insufficient documentation

## 2013-03-16 DIAGNOSIS — R5381 Other malaise: Secondary | ICD-10-CM | POA: Insufficient documentation

## 2013-03-16 DIAGNOSIS — E119 Type 2 diabetes mellitus without complications: Secondary | ICD-10-CM | POA: Insufficient documentation

## 2013-03-16 DIAGNOSIS — Z8739 Personal history of other diseases of the musculoskeletal system and connective tissue: Secondary | ICD-10-CM | POA: Insufficient documentation

## 2013-03-16 DIAGNOSIS — G4733 Obstructive sleep apnea (adult) (pediatric): Secondary | ICD-10-CM | POA: Insufficient documentation

## 2013-03-16 DIAGNOSIS — Z9889 Other specified postprocedural states: Secondary | ICD-10-CM | POA: Insufficient documentation

## 2013-03-16 DIAGNOSIS — R1110 Vomiting, unspecified: Secondary | ICD-10-CM | POA: Insufficient documentation

## 2013-03-16 DIAGNOSIS — Z87891 Personal history of nicotine dependence: Secondary | ICD-10-CM | POA: Insufficient documentation

## 2013-03-16 DIAGNOSIS — G8918 Other acute postprocedural pain: Secondary | ICD-10-CM | POA: Insufficient documentation

## 2013-03-16 DIAGNOSIS — E785 Hyperlipidemia, unspecified: Secondary | ICD-10-CM | POA: Insufficient documentation

## 2013-03-16 DIAGNOSIS — E860 Dehydration: Secondary | ICD-10-CM | POA: Insufficient documentation

## 2013-03-16 DIAGNOSIS — Z79899 Other long term (current) drug therapy: Secondary | ICD-10-CM | POA: Insufficient documentation

## 2013-03-16 DIAGNOSIS — Z8709 Personal history of other diseases of the respiratory system: Secondary | ICD-10-CM | POA: Insufficient documentation

## 2013-03-16 DIAGNOSIS — R1310 Dysphagia, unspecified: Secondary | ICD-10-CM | POA: Insufficient documentation

## 2013-03-16 DIAGNOSIS — M109 Gout, unspecified: Secondary | ICD-10-CM | POA: Insufficient documentation

## 2013-03-16 DIAGNOSIS — E669 Obesity, unspecified: Secondary | ICD-10-CM | POA: Insufficient documentation

## 2013-03-16 NOTE — ED Notes (Signed)
Patient laying on stretcher at this time. Denies any pain. resp are even and unlabored. No acute distress. Family member at bedside with patient. Call light with patient. Will continue to monitor.

## 2013-03-16 NOTE — ED Notes (Signed)
PT. REPORTS DIZZINESS AND OCCASIONAL " COUGHING UP BLOOD' , STATES TONSILLECTOMY LAST WEEK , AIRWAY INTACT , SLIGHT SOB .

## 2013-03-17 LAB — CBC
HCT: 37.3 % — ABNORMAL LOW (ref 39.0–52.0)
Hemoglobin: 12.1 g/dL — ABNORMAL LOW (ref 13.0–17.0)
MCH: 23.5 pg — ABNORMAL LOW (ref 26.0–34.0)
RBC: 5.15 MIL/uL (ref 4.22–5.81)

## 2013-03-17 LAB — POCT I-STAT, CHEM 8
BUN: 30 mg/dL — ABNORMAL HIGH (ref 6–23)
Calcium, Ion: 1.18 mmol/L (ref 1.12–1.23)
Chloride: 100 mEq/L (ref 96–112)
Creatinine, Ser: 0.9 mg/dL (ref 0.50–1.35)

## 2013-03-17 LAB — TROPONIN I: Troponin I: <0.3 ng/mL (ref <0.30)

## 2013-03-17 MED ORDER — MORPHINE SULFATE 4 MG/ML IJ SOLN
4.0000 mg | Freq: Once | INTRAMUSCULAR | Status: AC
  Administered 2013-03-17: 4 mg via INTRAVENOUS
  Filled 2013-03-17: qty 1

## 2013-03-17 MED ORDER — ONDANSETRON HCL 4 MG/2ML IJ SOLN
4.0000 mg | Freq: Once | INTRAMUSCULAR | Status: AC
  Administered 2013-03-17: 4 mg via INTRAVENOUS
  Filled 2013-03-17: qty 2

## 2013-03-17 MED ORDER — SODIUM CHLORIDE 0.9 % IV BOLUS (SEPSIS)
1000.0000 mL | Freq: Once | INTRAVENOUS | Status: AC
  Administered 2013-03-17: 1000 mL via INTRAVENOUS

## 2013-03-17 NOTE — ED Provider Notes (Signed)
Medical screening examination/treatment/procedure(s) were performed by non-physician practitioner and as supervising physician I was immediately available for consultation/collaboration.   Hanley Seamen, MD 03/17/13 [phone]

## 2013-03-17 NOTE — ED Notes (Signed)
Patient resting on stretcher at this time. Patient states pain is at a 4/10. No acute distress noted. resp are even and unlabored. Denies any productive cough since last assessment. Family member at bedside. Will continue to monitor.

## 2013-03-17 NOTE — ED Notes (Signed)
Patient currently sitting up in bed; no respiratory or acute distress noted.  Patient updated on plan of care; informed patient that we are currently waiting on lab results to come back.  Patient given warm blanket, per request.  Denies any other needs at this time; will continue to monitor.

## 2013-03-17 NOTE — ED Provider Notes (Signed)
History     CSN: [id number]  Arrival date & time 03/16/13  1851   First MD Initiated Contact with Patient 03/17/13 0033      Chief Complaint  Patient presents with  . Dizziness    (Consider location/radiation/quality/duration/timing/severity/associated sxs/prior treatment) HPI Comments: Activity and turbinate reduction by Dr. Emeline Schmitt on March 14 states he was doing well although he was having persistent pain and decreased by mouth intake until today when he had " 8 episodes."  Of vomiting now reports, dizziness.  Did not call Dr. Emeline Schmitt.  States has been taking liquid Vicodin for pain control, but has not taken any since 1 yesterday afternoon.  Denies any fever, diarrhea  The history is provided by the patient.    Past Medical History  Diagnosis Date  . DM (diabetes mellitus)   . Hyperlipidemia   . Hypertension   . Arthritis   . OSA (obstructive sleep apnea)     With: tonsillar hypertrophy, snoring, septal deviation, inferior turbinate hypertrophy, eustachian tube dysfunction  . Obesity   . Gout   . Sinusitis, chronic     Past Surgical History  Procedure Laterality Date  . Tympanostomy tube placement    . Dental surgery    . Septoplasty  03/06/2013    endoscopic  . Tympanostomy tube placement Right 03/06/2013  . Tonsillectomy/adenoidectomy/turbinate reduction  03/06/2013    inferior  . Nasal septoplasty w/ turbinoplasty N/A 03/06/2013    Procedure: NASAL SEPTOPLASTY WITH TURBINATE REDUCTION;  Surgeon: Stephen Beam, MD;  Location: [facility] OR;  Service: ENT;  Laterality: N/A;  . Tonsillectomy Bilateral 03/06/2013    Procedure: TONSILLECTOMY;  Surgeon: Stephen Beam, MD;  Location: [facility] OR;  Service: ENT;  Laterality: Bilateral;  . Myringotomy with tube placement Bilateral 03/06/2013    Procedure: MYRINGOTOMY WITH TUBE PLACEMENT;  Surgeon: Stephen Beam, MD;  Location: [facility] OR;  Service: ENT;  Laterality: Bilateral;   Replacement of left tube and new tube in right ear.    History  reviewed. No pertinent family history.  History  Substance Use Topics  . Smoking status: Former Smoker -- 0.50 packs/day for 15 years    Types: Cigarettes    Quit date: 12/24/2002  . Smokeless tobacco: Not on file  . Alcohol Use: 7.2 oz/week    12 Cans of beer per week      Review of Systems  Constitutional: Negative for fever.  HENT: Positive for sore throat and trouble swallowing. Negative for nosebleeds and congestion.   Gastrointestinal: Positive for vomiting. Negative for nausea.  Neurological: Positive for weakness.  All other systems reviewed and are negative.    Allergies  Iodine; Shrimp; and Papaya derivatives  Home Medications   Current Outpatient Rx  Name  Route  Sig  Dispense  Refill  . atorvastatin (LIPITOR) 20 MG tablet   Oral   Take 20 mg by mouth daily.         Marland Kitchen HYDROcodone-acetaminophen (NORCO/VICODIN) 5-325 MG per tablet   Oral   Take 1 tablet by mouth every 4 (four) hours as needed for pain.         Marland Kitchen ibuprofen (ADVIL,MOTRIN) 800 MG tablet   Oral   Take 800 mg by mouth 2 (two) times daily as needed for pain.          Marland Kitchen lisinopril (PRINIVIL,ZESTRIL) 2.5 MG tablet   Oral   Take 2.5 mg by mouth daily.         . metFORMIN (GLUCOPHAGE) 500 MG tablet  Oral   Take 500 mg by mouth daily.         Marland Kitchen ofloxacin (FLOXIN) 0.3 % otic solution   Both Ears   Place 5 drops into both ears 2 (two) times daily.           BP 107/73  Pulse 86  Temp(Src) 98.8 F (37.1 C) (Oral)  Resp 16  SpO2 96%  Physical Exam  Nursing note and vitals reviewed. Constitutional: He appears well-developed and well-nourished.  HENT:  Head: Normocephalic. No trismus in the jaw.  Mouth/Throat: Uvula is midline. Posterior oropharyngeal erythema present. No oropharyngeal exudate, posterior oropharyngeal edema or tonsillar abscesses.    Eyes: Pupils are equal, round, and reactive to light.  Neck: Normal range of motion.  Cardiovascular: Normal rate.     Pulmonary/Chest: Effort normal.  Abdominal: Soft.  Musculoskeletal: Normal range of motion.  Lymphadenopathy:    He has no cervical adenopathy.  Neurological: He is alert.  Skin: Skin is warm.    ED Course  Procedures (including critical care time)  Labs Reviewed  CBC - Abnormal; Notable for the following:    WBC 11.6 (*)    Hemoglobin 12.1 (*)    HCT 37.3 (*)    MCV 72.4 (*)    MCH 23.5 (*)    All other components within normal limits  POCT I-STAT, CHEM 8 - Abnormal; Notable for the following:    Potassium 3.4 (*)    BUN 30 (*)    Glucose, Bld 121 (*)    All other components within normal limits  TROPONIN I   No results found.   1. Dehydration, mild   2. Post-operative pain       MDM  Feels better          Arman Filter, NP 03/17/13 [phone]

## 2013-03-17 NOTE — ED Notes (Signed)
Patient resting on stretcher. States he is feeling better. States he needs to use the restroom, patient ambulated to restroom. No acute distress noted, resp are even and unlabored. Call light at bedside. Family member with patient. Will continue to monitor.

## 2013-03-17 NOTE — ED Notes (Signed)
Patient given discharge instructions for dehydration. No rx was provided. Advised to follow up with primary care or to return to this department if condition worsens. Patient voiced understanding of all instructions and had no further questions. Patient ambulated to front lobby without difficulty

## 2015-09-14 DIAGNOSIS — J343 Hypertrophy of nasal turbinates: Secondary | ICD-10-CM | POA: Insufficient documentation

## 2015-09-14 DIAGNOSIS — E669 Obesity, unspecified: Secondary | ICD-10-CM | POA: Insufficient documentation

## 2015-09-14 DIAGNOSIS — M1990 Unspecified osteoarthritis, unspecified site: Secondary | ICD-10-CM | POA: Insufficient documentation

## 2015-09-14 DIAGNOSIS — R12 Heartburn: Secondary | ICD-10-CM | POA: Insufficient documentation

## 2015-09-14 DIAGNOSIS — J329 Chronic sinusitis, unspecified: Secondary | ICD-10-CM | POA: Insufficient documentation

## 2015-09-14 DIAGNOSIS — H6980 Other specified disorders of Eustachian tube, unspecified ear: Secondary | ICD-10-CM | POA: Insufficient documentation

## 2015-09-14 DIAGNOSIS — J342 Deviated nasal septum: Secondary | ICD-10-CM | POA: Insufficient documentation

## 2015-09-14 DIAGNOSIS — F329 Major depressive disorder, single episode, unspecified: Secondary | ICD-10-CM | POA: Insufficient documentation

## 2015-09-14 DIAGNOSIS — M109 Gout, unspecified: Secondary | ICD-10-CM | POA: Insufficient documentation

## 2015-09-20 ENCOUNTER — Ambulatory Visit (INDEPENDENT_AMBULATORY_CARE_PROVIDER_SITE_OTHER): Payer: Managed Care, Other (non HMO) | Admitting: Cardiology

## 2015-09-20 ENCOUNTER — Encounter: Payer: Self-pay | Admitting: Cardiology

## 2015-09-20 VITALS — BP 110/80 | HR 59 | Ht 72.0 in | Wt 295.2 lb

## 2015-09-20 DIAGNOSIS — K219 Gastro-esophageal reflux disease without esophagitis: Secondary | ICD-10-CM | POA: Insufficient documentation

## 2015-09-20 DIAGNOSIS — R0789 Other chest pain: Secondary | ICD-10-CM

## 2015-09-20 DIAGNOSIS — R9431 Abnormal electrocardiogram [ECG] [EKG]: Secondary | ICD-10-CM

## 2015-09-20 DIAGNOSIS — I10 Essential (primary) hypertension: Secondary | ICD-10-CM

## 2015-09-20 DIAGNOSIS — E119 Type 2 diabetes mellitus without complications: Secondary | ICD-10-CM | POA: Insufficient documentation
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
## 2015-09-20 NOTE — Patient Instructions (Signed)
Medication Instructions:  The current medical regimen is effective;  continue present plan and medications.  Testing/Procedures: Your physician has requested that you have a stress echocardiogram. For further information please visit [email or web address] Please follow instruction sheet as given.  Follow-Up: Follow up as needed based on the results of your stress test.  Thank you for choosing [facility]!!

## 2015-10-03 ENCOUNTER — Ambulatory Visit (HOSPITAL_COMMUNITY): Payer: Managed Care, Other (non HMO) | Attending: Internal Medicine

## 2015-10-03 ENCOUNTER — Ambulatory Visit (HOSPITAL_BASED_OUTPATIENT_CLINIC_OR_DEPARTMENT_OTHER): Payer: Managed Care, Other (non HMO)

## 2015-10-03 DIAGNOSIS — Z6841 Body Mass Index (BMI) 40.0 and over, adult: Secondary | ICD-10-CM | POA: Insufficient documentation

## 2015-10-03 DIAGNOSIS — I10 Essential (primary) hypertension: Secondary | ICD-10-CM

## 2015-10-03 DIAGNOSIS — R0789 Other chest pain: Secondary | ICD-10-CM

## 2015-10-03 DIAGNOSIS — R0989 Other specified symptoms and signs involving the circulatory and respiratory systems: Secondary | ICD-10-CM

## 2015-11-23 ENCOUNTER — Emergency Department (HOSPITAL_COMMUNITY)
Admission: EM | Admit: 2015-11-23 | Discharge: 2015-11-23 | Disposition: A | Discharge status: Home / Self Care | Payer: Managed Care, Other (non HMO) | Attending: Emergency Medicine | Admitting: Emergency Medicine

## 2015-11-23 ENCOUNTER — Emergency Department (HOSPITAL_COMMUNITY): Payer: Managed Care, Other (non HMO)

## 2015-11-23 ENCOUNTER — Encounter (HOSPITAL_COMMUNITY): Payer: Self-pay | Admitting: Emergency Medicine

## 2015-11-23 DIAGNOSIS — Z87891 Personal history of nicotine dependence: Secondary | ICD-10-CM | POA: Insufficient documentation

## 2015-11-23 DIAGNOSIS — Z8709 Personal history of other diseases of the respiratory system: Secondary | ICD-10-CM | POA: Diagnosis not present

## 2015-11-23 DIAGNOSIS — Y998 Other external cause status: Secondary | ICD-10-CM | POA: Insufficient documentation

## 2015-11-23 DIAGNOSIS — Y92410 Unspecified street and highway as the place of occurrence of the external cause: Secondary | ICD-10-CM | POA: Insufficient documentation

## 2015-11-23 DIAGNOSIS — E119 Type 2 diabetes mellitus without complications: Secondary | ICD-10-CM | POA: Insufficient documentation

## 2015-11-23 DIAGNOSIS — Z7984 Long term (current) use of oral hypoglycemic drugs: Secondary | ICD-10-CM | POA: Insufficient documentation

## 2015-11-23 DIAGNOSIS — S8991XA Unspecified injury of right lower leg, initial encounter: Secondary | ICD-10-CM | POA: Diagnosis not present

## 2015-11-23 DIAGNOSIS — Z79899 Other long term (current) drug therapy: Secondary | ICD-10-CM | POA: Insufficient documentation

## 2015-11-23 DIAGNOSIS — Y9355 Activity, bike riding: Secondary | ICD-10-CM | POA: Insufficient documentation

## 2015-11-23 DIAGNOSIS — S79921A Unspecified injury of right thigh, initial encounter: Secondary | ICD-10-CM | POA: Diagnosis not present

## 2015-11-23 DIAGNOSIS — Z8659 Personal history of other mental and behavioral disorders: Secondary | ICD-10-CM | POA: Diagnosis not present

## 2015-11-23 DIAGNOSIS — Z8669 Personal history of other diseases of the nervous system and sense organs: Secondary | ICD-10-CM | POA: Insufficient documentation

## 2015-11-23 DIAGNOSIS — E785 Hyperlipidemia, unspecified: Secondary | ICD-10-CM | POA: Diagnosis not present

## 2015-11-23 DIAGNOSIS — M79604 Pain in right leg: Secondary | ICD-10-CM

## 2015-11-23 DIAGNOSIS — E669 Obesity, unspecified: Secondary | ICD-10-CM | POA: Insufficient documentation

## 2015-11-23 DIAGNOSIS — Z8739 Personal history of other diseases of the musculoskeletal system and connective tissue: Secondary | ICD-10-CM | POA: Diagnosis not present

## 2015-11-23 DIAGNOSIS — I10 Essential (primary) hypertension: Secondary | ICD-10-CM | POA: Insufficient documentation

## 2015-11-23 MED ORDER — IBUPROFEN 800 MG PO TABS: 800.0000 mg | ORAL_TABLET | Freq: Three times a day (TID) | ORAL | Status: AC

## 2015-11-23 MED ORDER — IBUPROFEN 400 MG PO TABS
800.0000 mg | ORAL_TABLET | Freq: Once | ORAL | Status: AC
  Administered 2015-11-23: 800 mg via ORAL
  Filled 2015-11-23: qty 2
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
## 2015-11-23 NOTE — ED Notes (Signed)
Pt A&OX4 and ambulatory to room with steady gait, NAD. See PAs notes for secondary assessment.

## 2016-03-29 ENCOUNTER — Ambulatory Visit: Payer: Managed Care, Other (non HMO)

## 2016-04-05 ENCOUNTER — Ambulatory Visit: Payer: Managed Care, Other (non HMO)

## 2016-04-12 ENCOUNTER — Ambulatory Visit: Payer: Managed Care, Other (non HMO)

## 2016-04-26 ENCOUNTER — Encounter: Payer: BLUE CROSS/BLUE SHIELD | Attending: Family Medicine | Admitting: *Deleted

## 2016-04-26 ENCOUNTER — Encounter: Payer: Self-pay | Admitting: *Deleted

## 2016-04-26 VITALS — Ht 73.0 in | Wt 295.3 lb

## 2016-04-26 DIAGNOSIS — E119 Type 2 diabetes mellitus without complications: Secondary | ICD-10-CM | POA: Diagnosis present

## 2016-04-26 NOTE — Patient Instructions (Signed)
Plan:  Aim for 4 Carb Choices per meal (60 grams) +/- 1 either way  Aim for 0-2 Carbs per snack if hungry  Include protein in moderation with your meals and snacks Consider reading food labels for Total Carbohydrate of foods Continue with your activity level daily as tolerated Consider checking BG at alternate times per day  Consider taking medication as directed by MD

## 2016-05-04 NOTE — Progress Notes (Signed)
Diabetes Self-Management Education  Visit Type: First/Initial  Appt. Start Time: 0830 Appt. End Time: 1000  05/04/2016  [name], identified by name and date of birth, is a 45 y.o. male with a diagnosis of Diabetes: Type 2.   ASSESSMENT  Height  (1.854 m), weight 295 lb 4.8 oz (133.947 kg). Body mass index is 38.97 kg/(m^2).      Diabetes Self-Management Education - 04/26/16 0851    Initial Visit   Diabetes Type Type 2   Are you currently following a meal plan? No   Are you taking your medications as prescribed? Yes   Date Diagnosed 4 years   Health Coping   How would you rate your overall health? Fair   Psychosocial Assessment   Patient Belief/Attitude about Diabetes Defeat/Burnout  not good with food or meds   Other persons present Patient   Preferred Learning Style Auditory;Hands on   What is the last grade level you completed in school? 12   Complications   Last HgB A1C per patient/outside source --  not provided   How often do you check your blood sugar? 0 times/day (not testing)   Have you had a dilated eye exam in the past 12 months? Yes   Have you had a dental exam in the past 12 months? Yes   Are you checking your feet? Yes   How many days per week are you checking your feet? 1   Patient Education   Disease state  Definition of diabetes, type 1 and 2, and the diagnosis of diabetes   Nutrition management  Role of diet in the treatment of diabetes and the relationship between the three main macronutrients and blood glucose level;Food label reading, portion sizes and measuring food.;Carbohydrate counting   Physical activity and exercise  Role of exercise on diabetes management, blood pressure control and cardiac health.   Medications Reviewed patients medication for diabetes, action, purpose, timing of dose and side effects.   Monitoring Purpose and frequency of SMBG.;Identified appropriate SMBG and/or A1C goals.   Chronic complications Relationship  between chronic complications and blood glucose control   Psychosocial adjustment Role of stress on diabetes   Individualized Goals (developed by patient)   Nutrition Follow meal plan discussed   Physical Activity Exercise 3-5 times per week   Medications take my medication as prescribed   Monitoring  test blood glucose pre and post meals as discussed   Post-Education Assessment   Patient understands the diabetes disease and treatment process. Demonstrates understanding / competency   Patient understands incorporating nutritional management into lifestyle. Demonstrates understanding / competency   Patient undertands incorporating physical activity into lifestyle. Demonstrates understanding / competency   Patient understands using medications safely. Demonstrates understanding / competency   Patient understands monitoring blood glucose, interpreting and using results Demonstrates understanding / competency   Patient understands prevention, detection, and treatment of chronic complications. Demonstrates understanding / competency   Outcomes   Expected Outcomes Demonstrated interest in learning. Expect positive outcomes   Future DMSE PRN   Program Status Completed      Individualized Plan for Diabetes Self-Management Training:   Learning Objective:  Patient will have a greater understanding of diabetes self-management. Patient education plan is to attend individual and/or group sessions per assessed needs and concerns.   Plan:   Patient Instructions  Plan:  Aim for 4 Carb Choices per meal (60 grams) +/- 1 either way  Aim for 0-2 Carbs per snack if hungry  Include protein  in moderation with your meals and snacks Consider reading food labels for Total Carbohydrate of foods Continue with your activity level daily as tolerated Consider checking BG at alternate times per day  Consider taking medication as directed by MD      Expected Outcomes:  Demonstrated interest in learning.  Expect positive outcomes  Education material provided: Living Well with Diabetes, A1C conversion sheet, Meal plan card and Carbohydrate counting sheet  If problems or questions, patient to contact team via:  Phone and Email  Future DSME appointment: PRN
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
